# Patient Record
Sex: Male | Born: 1990 | Race: Black or African American | Hispanic: No | Marital: Single
Health system: Southern US, Community
[De-identification: ages and names within clinical notes are randomized; demographics above are authoritative.]

---

## 2018-11-30 ENCOUNTER — Encounter (HOSPITAL_COMMUNITY): Payer: Self-pay

## 2018-11-30 ENCOUNTER — Emergency Department (HOSPITAL_COMMUNITY)
Admission: EM | Admit: 2018-11-30 | Discharge: 2018-11-30 | Disposition: A | Discharge status: Home / Self Care | Payer: BLUE CROSS/BLUE SHIELD | Attending: Emergency Medicine | Admitting: Emergency Medicine

## 2018-11-30 ENCOUNTER — Other Ambulatory Visit: Payer: Self-pay

## 2018-11-30 ENCOUNTER — Emergency Department (HOSPITAL_COMMUNITY): Payer: BLUE CROSS/BLUE SHIELD

## 2018-11-30 DIAGNOSIS — L02416 Cutaneous abscess of left lower limb: Secondary | ICD-10-CM | POA: Diagnosis not present

## 2018-11-30 DIAGNOSIS — L0291 Cutaneous abscess, unspecified: Secondary | ICD-10-CM

## 2018-11-30 MED ORDER — CLINDAMYCIN HCL 300 MG PO CAPS: 300.0000 mg | ORAL_CAPSULE | Freq: Three times a day (TID) | ORAL | 0 refills | Status: AC

## 2018-11-30 MED ORDER — CLINDAMYCIN HCL 150 MG PO CAPS
300.0000 mg | ORAL_CAPSULE | Freq: Once | ORAL | Status: AC
  Administered 2018-11-30: 300 mg via ORAL
  Filled 2018-11-30: qty 2

## 2018-11-30 NOTE — Discharge Instructions (Signed)
You were evaluated today for skin abscess.  This was actively draining.  You did not want additional incision and drainage at this time.  I have given you antibiotics.  Please take as prescribed.  If you notice increased swelling, warmth, fever, chills please seek reevaluation.  You will need to be reassessed in 2 days for wound check.  Return to the ED for any new or worsening symptoms.

## 2018-11-30 NOTE — ED Provider Notes (Signed)
[facility]   CSN: [id number] Arrival date & time: 11/30/18  1214  History   Chief Complaint Chief Complaint  Patient presents with  . Abscess    HPI Dylan Robinson is a 28 y.o. male with no significant past medical history who presents for evaluation of "pimple."  Patient states he had a small bump arise in the proximal portion of his left knee.  Stated this looked like a pimple originally.  States he popped this yesterday and has continued to drain.  He has noticed some pain when he palpates this area.  He has had normal range of motion of his lower extremities.  He denies fever, chills, nausea, vomiting, abdominal pain, decreased range of motion, numbness or tingling, swelling or pain in his joints.  He denies swelling, redness.  He states this area did feel mildly warm this morning.  Is not take anything for symptoms.  Denies prior history of abscesses. Rates his pain a 4/10. Denies radiation of pain. Has not taken anything for his pain. Denies any additional aggravating or alleviating factors. No urinary Sx.  History obtained from patient. No interpretor was used.     HPI  History reviewed. No pertinent past medical history.  There are no active problems to display for this patient.   History reviewed. No pertinent surgical history.      Home Medications    Prior to Admission medications   Medication Sig Start Date End Date Taking? Authorizing Provider  clindamycin (CLEOCIN) 300 MG capsule Take 1 capsule (300 mg total) by mouth 3 (three) times daily for 5 days. 11/30/18 12/05/18  Daneshia Tavano A, PA-C    Family History No family history on file.  Social History Social History   Tobacco Use  . Smoking status: Not on file  Substance Use Topics  . Alcohol use: Not on file  . Drug use: Not on file     Allergies   Patient has no allergy information on record.   Review of Systems Review of Systems   Constitutional: Negative.   HENT: Negative.   Respiratory: Negative.   Cardiovascular: Negative.   Gastrointestinal: Negative.   Genitourinary: Negative.   Musculoskeletal: Negative.   Skin: Positive for wound.  Neurological: Negative.   All other systems reviewed and are negative.  Physical Exam Updated Vital Signs BP (!) 117/53 (BP Location: Right Arm)   Pulse 69   Temp 98.3 F (36.8 C) (Oral)   Resp 12   Ht 5\' 8"  (1.727 m)   Wt 72.6 kg   SpO2 100%   BMI 24.33 kg/m   Physical Exam Vitals signs and nursing note reviewed.  Constitutional:      General: He is not in acute distress.    Appearance: He is well-developed. He is not ill-appearing, toxic-appearing or diaphoretic.  HENT:     Head: Normocephalic and atraumatic.     Nose: Nose normal.     Mouth/Throat:     Mouth: Mucous membranes are moist.     Pharynx: Oropharynx is clear.  Eyes:     Pupils: Pupils are equal, round, and reactive to light.  Neck:     Musculoskeletal: Normal range of motion and neck supple.  Cardiovascular:     Rate and Rhythm: Normal rate and regular rhythm.     Pulses: Normal pulses.     Heart sounds: Normal heart sounds. No murmur. No friction rub. No gallop.   Pulmonary:  Effort: Pulmonary effort is normal. No respiratory distress.     Breath sounds: Normal breath sounds. No stridor. No wheezing, rhonchi or rales.  Abdominal:     General: Bowel sounds are normal. There is no distension.     Palpations: Abdomen is soft.     Tenderness: There is no abdominal tenderness. There is no guarding or rebound.  Musculoskeletal: Normal range of motion.     Comments: Full ROM BLE without difficulty.  No tenderness palpation. He has no injury or swelling.  Bilateral calves without tenderness.  He has negative varus, valgus stress bilaterally.  Negative anterior drawer bilaterally.  Left ankle monitor in place.  Skin:    General: Skin is warm and dry.     Comments: 4 cm area of rounded  induration to proximal knee. No area of fluctuance. No evidence of joint effusion. Punctate lesion with active drainge. Brisk cap refill. No erythema.  No rash, vesicles, bullae, blisters.  Neurological:     Mental Status: He is alert.       ED Treatments / Results  Labs (all labs ordered are listed, but only abnormal results are displayed) Labs Reviewed - No data to display  EKG None  Radiology Dg Knee Complete 4 Views Left  Result Date: 11/30/2018 CLINICAL DATA:  Draining wound of the left knee. Warmth around the wound. EXAM: LEFT KNEE - COMPLETE 4+ VIEW COMPARISON:  None. FINDINGS: Medial and anterior soft tissue swelling is present. There is no significant effusion. No acute osseous abnormality is present. The knee is located. IMPRESSION: 1. Anterior soft tissue swelling without underlying effusion or focal osseous abnormality. This may represent edema or cellulitis. Electronically Signed   By: Marin Roberts M.D.   On: 11/30/2018 14:24   Procedures Procedures (including critical care time) EMERGENCY DEPARTMENT US SOFT TISSUE INTERPRETATION "Study: Limited Soft Tissue Ultrasound"  INDICATIONS: Soft tissue infection Multiple views of the body part were obtained in real-time with a multi-frequency linear probe  PERFORMED BY: Myself IMAGES ARCHIVED?: Yes SIDE:Left BODY PART:Lower extremity INTERPRETATION:  Abcess present  Medications Ordered in ED Medications  clindamycin (CLEOCIN) capsule 300 mg (has no administration in time range)   Initial Impression / Assessment and Plan / ED Course  I have reviewed the triage vital signs and the nursing notes.  Pertinent labs & imaging results that were available during my care of the patient were reviewed by me and considered in my medical decision making (see chart for details).  28 year old male appears otherwise well presents for evaluation of knee drainage.  Afebrile, nonseptic, non-ill-appearing.  Patient states a  "pimple" to his left knee which began 2 days ago.  States that he popped this and has continued to drain.  He has noticed some mild "hardness and warmth" around the bump.  He denies any systemic symptoms.  He has full range of motion to his bilateral knees without difficulty.  He has no pain with flexion or extension.  He is negative varus, valgus stress as well as anterior drawer. Denies recent injuries or trauma. On exam he has a 4 cm area of induration just superior to his left knee.  He does have punctate area of drainage however there is no underlying fluctuance I can appreciate at this time.  He has some mild soft tissue swelling to this punctate area, however does not have swelling to the rest of his knee. He has no tenderness above or below this site.  He is no urinary symptoms to suggest  seeded gonorrhea infection.  He denies history of IV drug use.  X-ray does not show any evidence of fracture, dislocation, joint effusion or osteomylitis. There is some soft tissue swelling. Patient likely with beginning stages of abscess. Area actively draining. I was able to express purulent drainage from wound. I have cleaned out this area. I offered to open up this already draining area with patient.  Patient declines. Discussed risk versus benefit of opening up this developing abscess.  He understands risk versus benefit and does not want additional incision and drainage at this time. States "I just want antibiotics."  I have ultrasounded this area.  Consistent with very small abscess with mild surrounding cellulitis. Will place patient on antibiotics.  Patient given first dose in ED.  Discussed also heat, elevation to the area.  Patient to follow-up if this does not resolve or return to the ED for further drainage. Needs wound check in 2 days for re-evaluation. I have low suspicion for septic joint, gout, hemarthrosis given no pain with movement and lack of joint effusion on x-ray.  Patient is able to ambulate in ED  without difficulty. He is neurovascularly intact.  No systemic signs to indicate sepsis.  No history of IV drug use indicate seeded infection.  Patient is hemodynamically stable and in no acute distress.  Patient able to ambulate in department prior to ED.  Evaluation does not show acute pathology that would require ongoing or additional emergent interventions while in the emergency department or further inpatient treatment.  I have discussed the diagnosis with the patient and answered all questions.  Pain is been managed while in the emergency department and patient has no further complaints prior to discharge.  Patient is comfortable with plan discussed in room and is stable for discharge at this time.  I have discussed strict return precautions for returning to the emergency department.  Patient was encouraged to follow-up with PCP/specialist refer to at discharge.  I have discussed patient with my attending physician, Dr. Rush Landmark who agrees with the treatment, plan and disposition.     Final Clinical Impressions(s) / ED Diagnoses   Final diagnoses:  Abscess    ED Discharge Orders         Ordered    clindamycin (CLEOCIN) 300 MG capsule  3 times daily     11/30/18 1504           Aleksei Goodlin A, PA-C 11/30/18 1510    Tegeler, Canary Brim, MD 11/30/18 1606

## 2018-11-30 NOTE — ED Triage Notes (Signed)
Pt reports bump to left knee that he popped, no it is more swollen and draining. Warmth around bump. Denies fevers

## 2021-01-09 IMAGING — CR LEFT KNEE - COMPLETE 4+ VIEW
4 series · 4 of 4 positions shown · non-contrast
Comparison: None.

CLINICAL DATA: Draining wound of the left knee. Warmth around the
wound.

EXAM:
LEFT KNEE - COMPLETE 4+ VIEW

[knee ap]
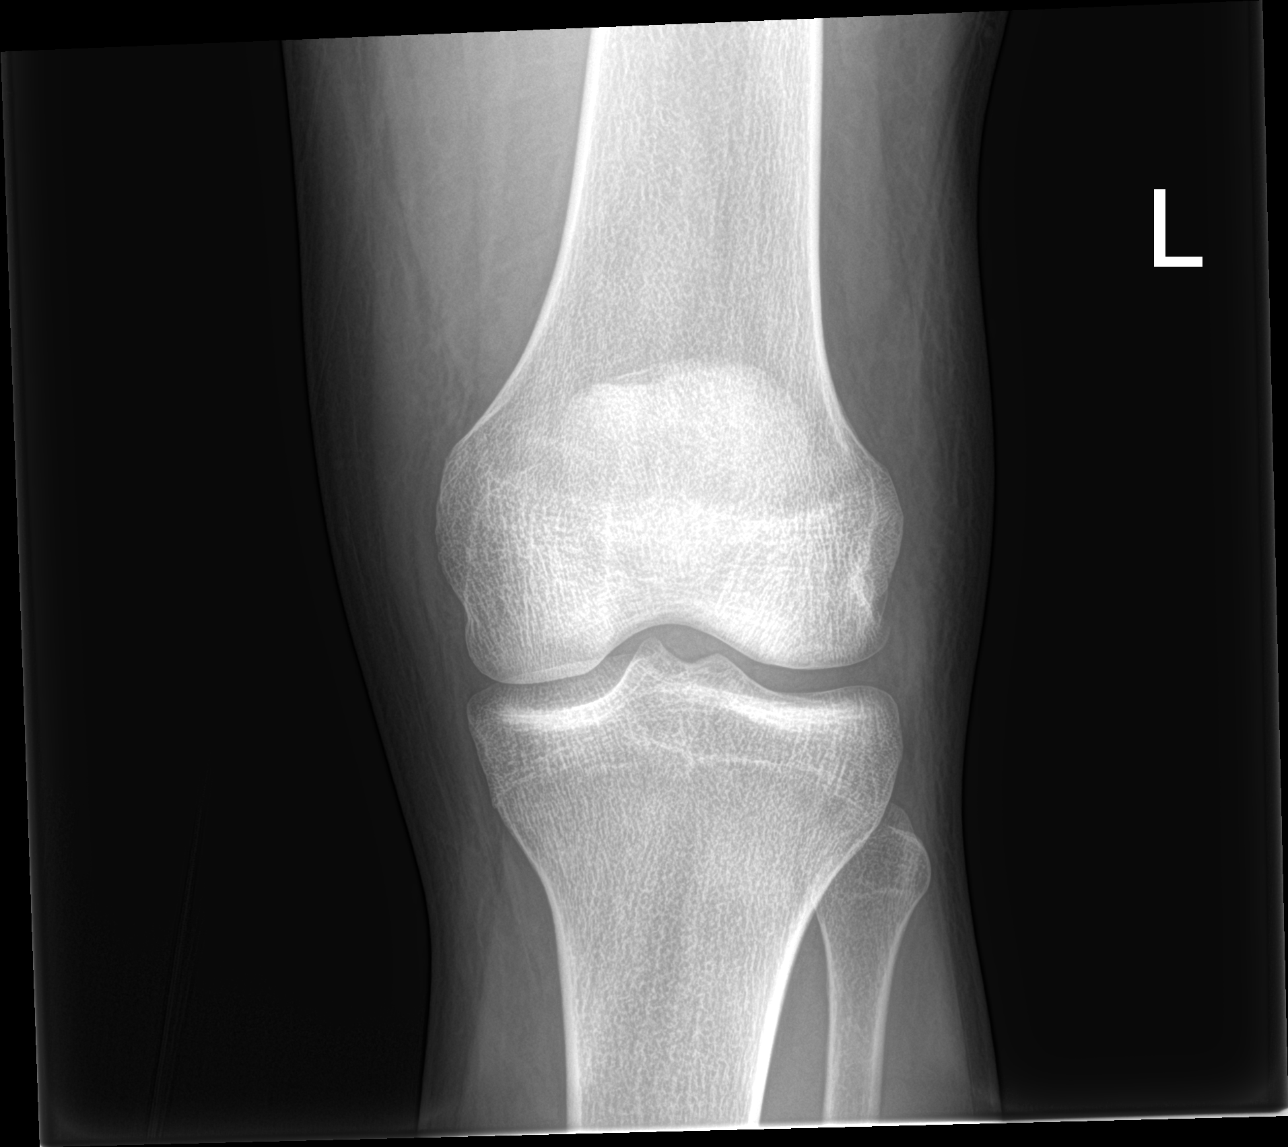

[knee lat]
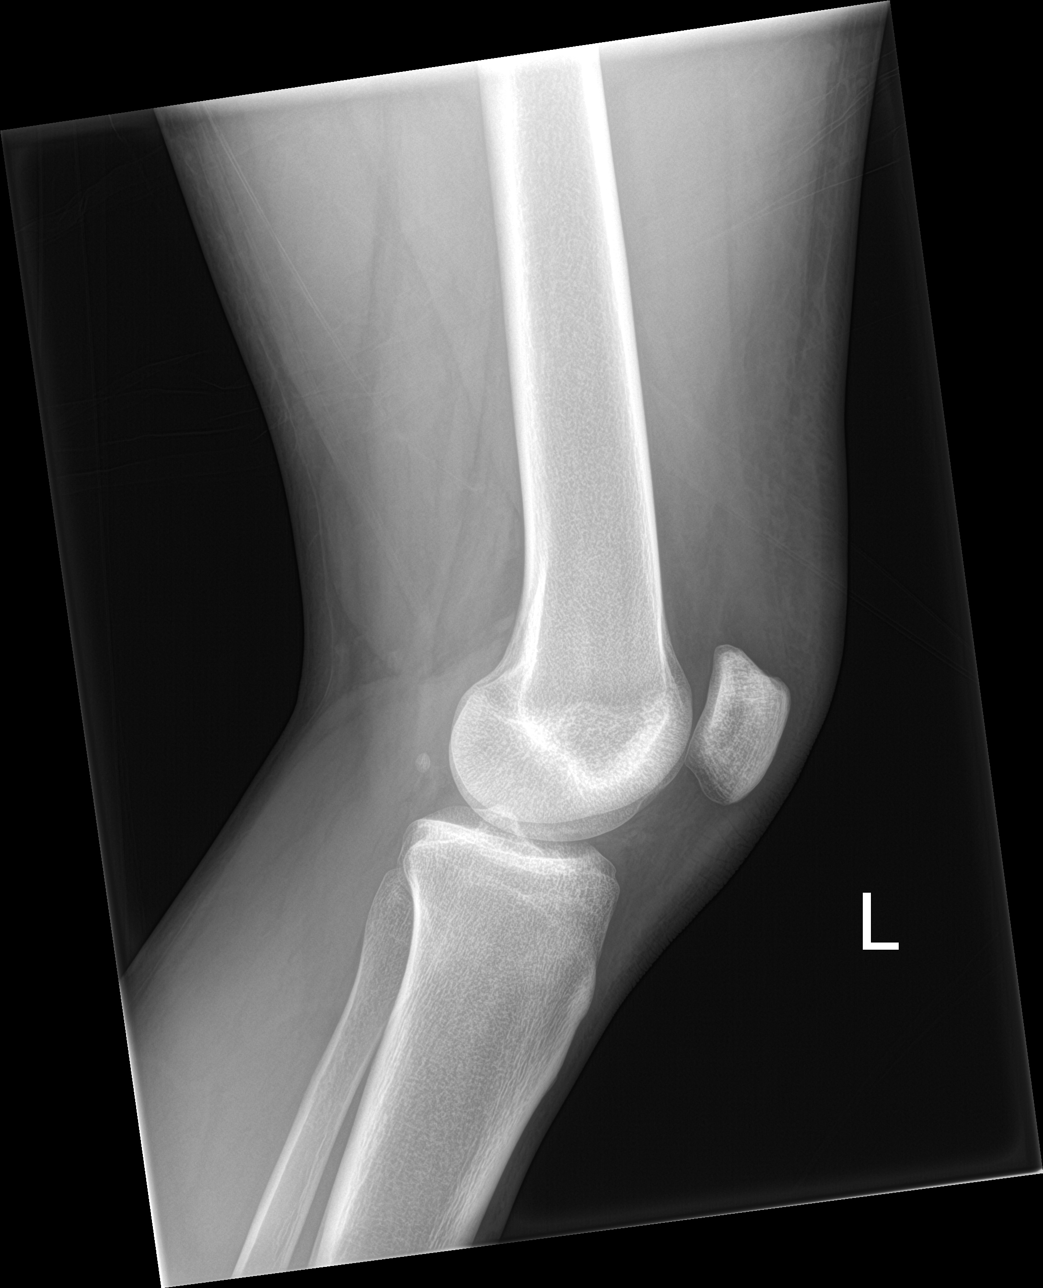

[knee obl (1 of 2)]
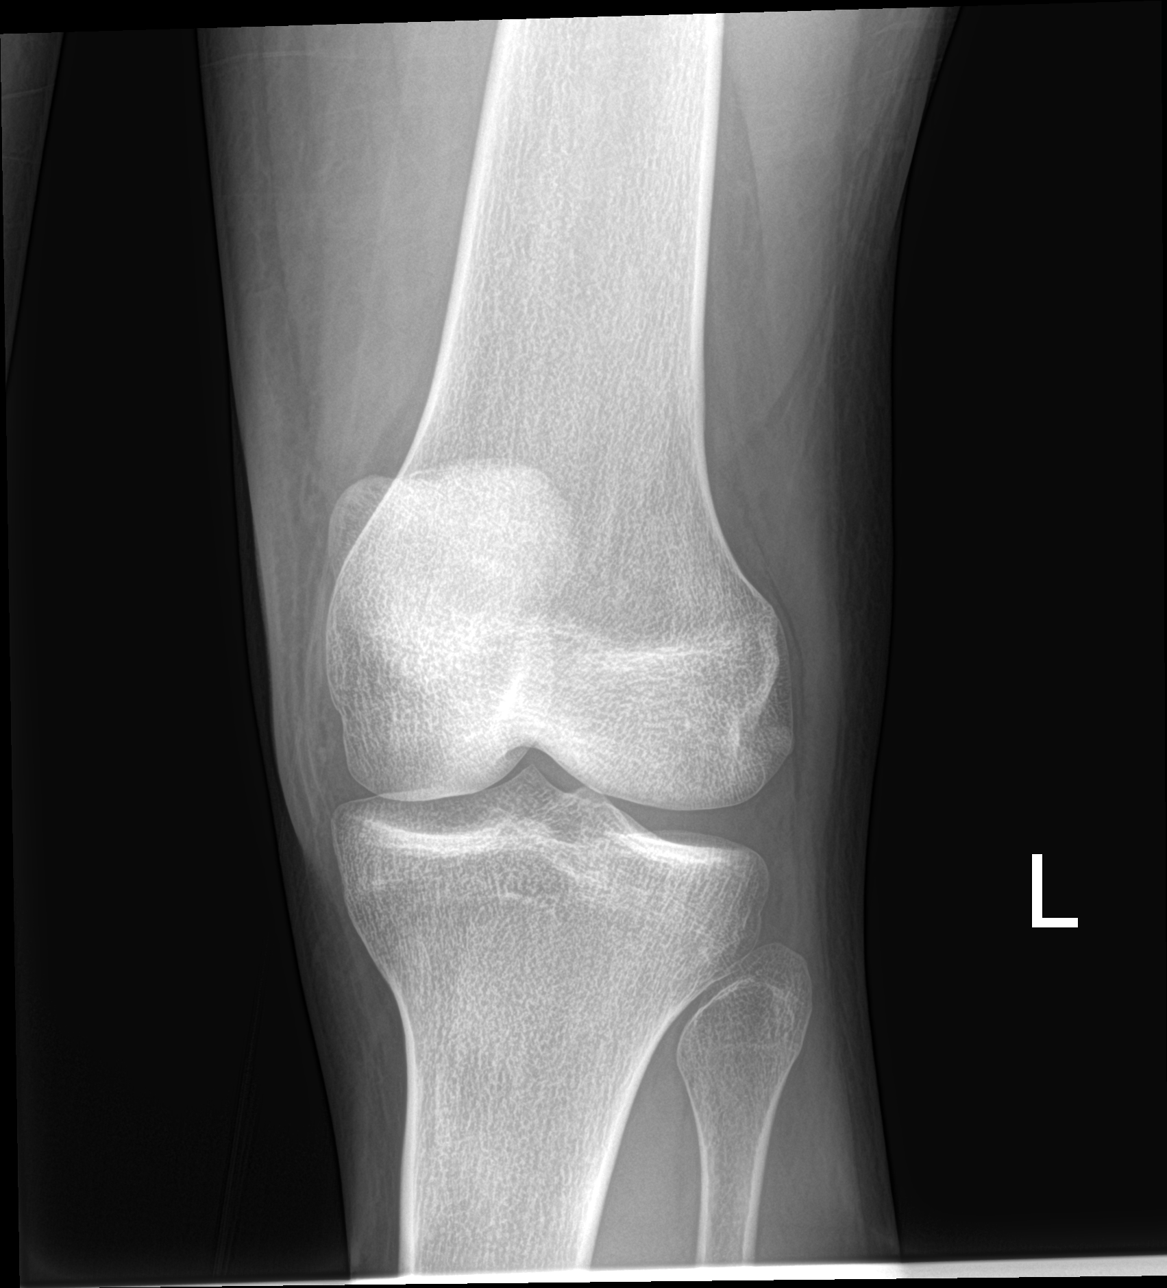

[knee obl (2 of 2)]
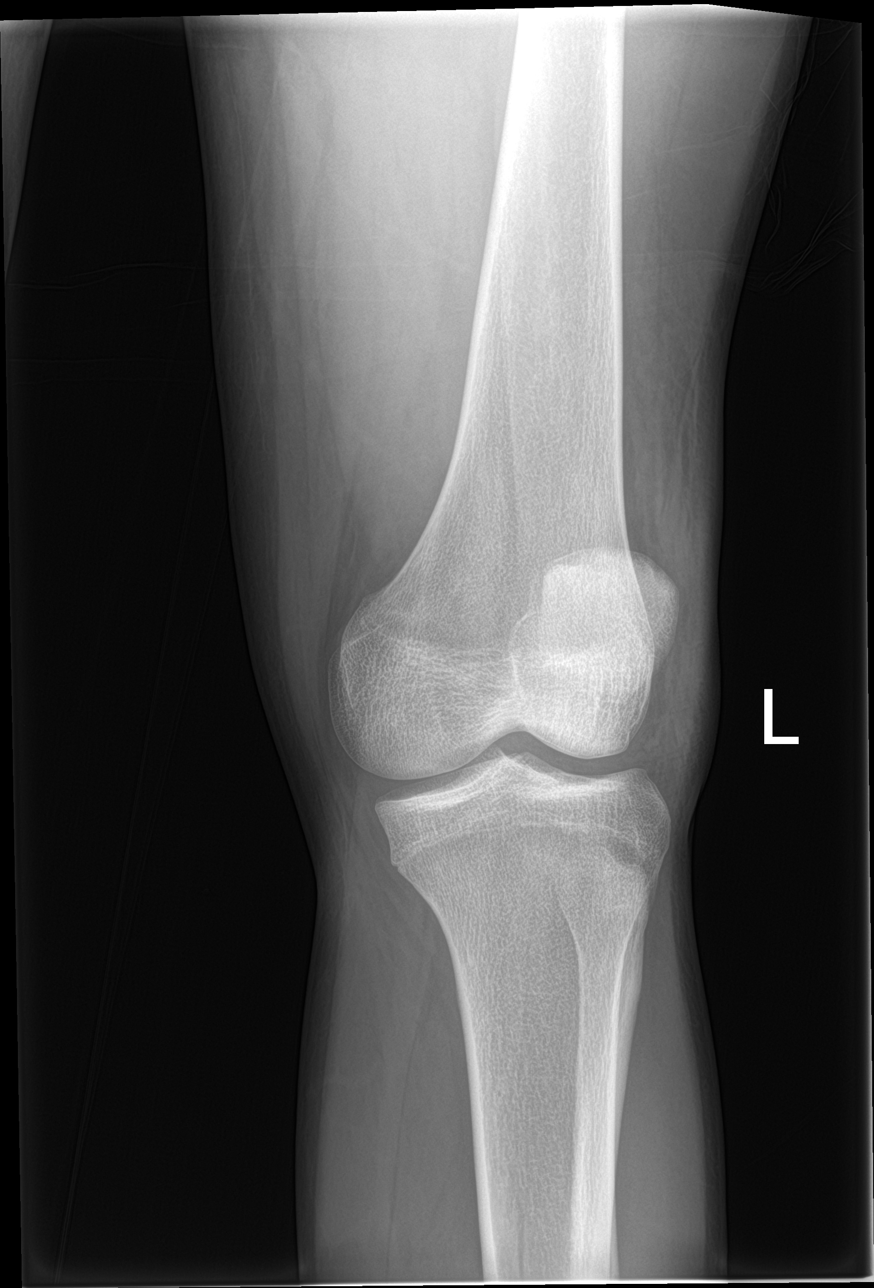

[4 of 4 positions shown; findings below may reference images not displayed]

FINDINGS: Medial and anterior soft tissue swelling is present. There is no
significant effusion. No acute osseous abnormality is present. The
knee is located.
IMPRESSION: 1. Anterior soft tissue swelling without underlying effusion or
focal osseous abnormality. This may represent edema or cellulitis.
